# Patient Record
Sex: Female | Born: 1995 | Race: Black or African American | Hispanic: No | Marital: Single
Health system: Southern US, Community
[De-identification: ages and names within clinical notes are randomized; demographics above are authoritative.]

## PROBLEM LIST (undated history)

## (undated) DIAGNOSIS — Z789 Other specified health status: Secondary | ICD-10-CM

## (undated) DIAGNOSIS — N12 Tubulo-interstitial nephritis, not specified as acute or chronic: Secondary | ICD-10-CM

## (undated) HISTORY — PX: WISDOM TOOTH EXTRACTION: SHX21

---
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
## 2014-01-03 ENCOUNTER — Ambulatory Visit (INDEPENDENT_AMBULATORY_CARE_PROVIDER_SITE_OTHER): Payer: Medicaid Other

## 2014-01-03 DIAGNOSIS — Z3042 Encounter for surveillance of injectable contraceptive: Secondary | ICD-10-CM

## 2014-01-03 DIAGNOSIS — Z3049 Encounter for surveillance of other contraceptives: Secondary | ICD-10-CM

## 2014-01-03 DIAGNOSIS — Z3202 Encounter for pregnancy test, result negative: Secondary | ICD-10-CM

## 2014-01-03 LAB — POCT URINE PREGNANCY: Preg Test, Ur: NEGATIVE

## 2014-01-03 MED ORDER — MEDROXYPROGESTERONE ACETATE 150 MG/ML IM SUSP
150.0000 mg | Freq: Once | INTRAMUSCULAR | Status: AC
  Administered 2014-01-03: 150 mg via INTRAMUSCULAR

## 2014-01-03 NOTE — Progress Notes (Signed)
Patient presented to the office late for Depo injection (due prior to 11/30).  Urine pregnancy test was negative.  Injection given without complication.  Patient is attending college 4 hours away.  Rescheduled a follow up for 03/27/14; advised patient if she transfer care to sign a ROI where she will be going and we will send medical records to them.  She verbalized understanding.

## 2014-03-27 ENCOUNTER — Ambulatory Visit: Payer: Medicaid Other | Admitting: Pediatrics

## 2014-03-31 ENCOUNTER — Ambulatory Visit (INDEPENDENT_AMBULATORY_CARE_PROVIDER_SITE_OTHER): Payer: Medicaid Other | Admitting: *Deleted

## 2014-03-31 ENCOUNTER — Encounter (INDEPENDENT_AMBULATORY_CARE_PROVIDER_SITE_OTHER): Payer: Self-pay

## 2014-03-31 DIAGNOSIS — Z309 Encounter for contraceptive management, unspecified: Secondary | ICD-10-CM | POA: Diagnosis not present

## 2014-03-31 DIAGNOSIS — Z113 Encounter for screening for infections with a predominantly sexual mode of transmission: Secondary | ICD-10-CM

## 2014-03-31 DIAGNOSIS — Z3202 Encounter for pregnancy test, result negative: Secondary | ICD-10-CM | POA: Diagnosis not present

## 2014-03-31 DIAGNOSIS — Z3042 Encounter for surveillance of injectable contraceptive: Secondary | ICD-10-CM

## 2014-03-31 LAB — POCT URINE PREGNANCY: PREG TEST UR: NEGATIVE

## 2014-03-31 MED ORDER — MEDROXYPROGESTERONE ACETATE 150 MG/ML IM SUSP
150.0000 mg | Freq: Once | INTRAMUSCULAR | Status: AC
Start: 2014-03-31 — End: 2014-03-31
  Administered 2014-03-31: 150 mg via INTRAMUSCULAR

## 2014-04-01 LAB — GC/CHLAMYDIA PROBE AMP, URINE
Chlamydia, Swab/Urine, PCR: NEGATIVE
GC Probe Amp, Urine: NEGATIVE

## 2014-04-14 NOTE — Progress Notes (Signed)
  Subjective:    Sherryll BurgerShytaysha is a 19 y.o. old female here for depoprovera injection.    HPI Patient her for depoprovera injection.  This is the second time that she has been late to receive her depo injection.  She reports that she is in school several hours away and has been trying to get her Depo-provera injections arranged at her student health.   She denies any menstrual concerns.  She has been sexually active with one female partner and does not always use condoms.  Last STI screening was 9 months ago and patient would like GC/Chlamydia testing today.  She [facility]   Review of Systems  No fevers, no heavy menstrual bleeding, no vaginal discharge.  History and Problem List: Sherryll BurgerShytaysha has Dysmenorrhea in the adolescent; Contraception management; Unspecified asthma(493.90); and Knee pain, bilateral on her problem list.  Sherryll BurgerShytaysha  has a past medical history of No pertinent past medical history; Asthma; and Pyelonephritis.       Objective:    LMP 03/24/2014 (Exact Date) Physical Exam  Constitutional: She is oriented to person, place, and time. She appears well-developed and well-nourished. No distress.  Neurological: She is alert and oriented to person, place, and time.  Psychiatric: She has a normal mood and affect. Her behavior is normal.       Assessment and Plan:   Sherryll BurgerShytaysha is a 19 y.o. old female her for contraceptive management and STI screening.  Reviewed importance of appropriate interval between Depo injections; briefly discussed alternative contraceptive options.  Patient wishes to continue with Depo at this time.    Return in about 3 months (around 07/01/2014) for Depoprovera.  Shamiah Kahler, Betti CruzKATE S, MD

## 2014-06-16 ENCOUNTER — Encounter (INDEPENDENT_AMBULATORY_CARE_PROVIDER_SITE_OTHER): Payer: Self-pay

## 2014-06-16 ENCOUNTER — Ambulatory Visit (INDEPENDENT_AMBULATORY_CARE_PROVIDER_SITE_OTHER): Payer: Medicaid Other | Admitting: *Deleted

## 2014-06-16 ENCOUNTER — Ambulatory Visit: Payer: Self-pay | Admitting: *Deleted

## 2014-06-16 DIAGNOSIS — Z3042 Encounter for surveillance of injectable contraceptive: Secondary | ICD-10-CM

## 2014-06-16 DIAGNOSIS — Z3049 Encounter for surveillance of other contraceptives: Secondary | ICD-10-CM

## 2014-06-16 MED ORDER — MEDROXYPROGESTERONE ACETATE 150 MG/ML IM SUSP
150.0000 mg | Freq: Once | INTRAMUSCULAR | Status: AC
  Administered 2014-06-16: 150 mg via INTRAMUSCULAR

## 2014-06-16 NOTE — Progress Notes (Signed)
Pt presents today for depo. Pt inside window for contraception coverage. No HCG needed. Allergies reviewed, no concerns, injection given and tolerated,  f/u appt scheduled.

## 2014-07-16 ENCOUNTER — Encounter (HOSPITAL_COMMUNITY): Payer: Self-pay | Admitting: *Deleted

## 2014-07-16 ENCOUNTER — Emergency Department (HOSPITAL_COMMUNITY)
Admission: EM | Admit: 2014-07-16 | Discharge: 2014-07-16 | Disposition: A | Discharge status: Home / Self Care | Payer: BLUE CROSS/BLUE SHIELD | Attending: Emergency Medicine | Admitting: Emergency Medicine

## 2014-07-16 DIAGNOSIS — X58XXXA Exposure to other specified factors, initial encounter: Secondary | ICD-10-CM | POA: Diagnosis not present

## 2014-07-16 DIAGNOSIS — Y999 Unspecified external cause status: Secondary | ICD-10-CM | POA: Diagnosis not present

## 2014-07-16 DIAGNOSIS — Z87448 Personal history of other diseases of urinary system: Secondary | ICD-10-CM | POA: Insufficient documentation

## 2014-07-16 DIAGNOSIS — S76311A Strain of muscle, fascia and tendon of the posterior muscle group at thigh level, right thigh, initial encounter: Secondary | ICD-10-CM | POA: Diagnosis not present

## 2014-07-16 DIAGNOSIS — Y929 Unspecified place or not applicable: Secondary | ICD-10-CM | POA: Diagnosis not present

## 2014-07-16 DIAGNOSIS — Y939 Activity, unspecified: Secondary | ICD-10-CM | POA: Diagnosis not present

## 2014-07-16 DIAGNOSIS — J45909 Unspecified asthma, uncomplicated: Secondary | ICD-10-CM | POA: Insufficient documentation

## 2014-07-16 DIAGNOSIS — S79921A Unspecified injury of right thigh, initial encounter: Secondary | ICD-10-CM | POA: Diagnosis present

## 2014-07-16 MED ORDER — IBUPROFEN 400 MG PO TABS
800.0000 mg | ORAL_TABLET | Freq: Once | ORAL | Status: AC
  Administered 2014-07-16: 800 mg via ORAL
  Filled 2014-07-16: qty 2

## 2014-07-16 MED ORDER — IBUPROFEN 600 MG PO TABS: 600.0000 mg | ORAL_TABLET | Freq: Four times a day (QID) | ORAL | Status: DC | PRN

## 2014-07-16 NOTE — ED Notes (Signed)
Applied ace wrap to right leg above the knee

## 2014-07-16 NOTE — ED Provider Notes (Signed)
History  This chart was scribed for non-physician practitioner, Francee Piccolo, PA-C,working with Elwin Mocha, MD, by Karle Plumber, ED Scribe. This patient was seen in room TR08C/TR08C and the patient's care was started at 9:02 PM.  Chief Complaint  Patient presents with  . Leg Pain   The history is provided by the patient and medical records. No language interpreter was used.    HPI Comments:  Crystal Velez is a 19 y.o. female who presents to the Emergency Department complaining of aching pain in the posterior right thigh that started approximately 7 hours ago. She states she was at work and reached to help a pt stating she may have stepped off of it the wrong way and feels as if she pulled a muscle. She finished her shift but has not taken anything for pain. Bearing weight on the leg makes the pain worse. She denies any alleviating factors. She denies SOB, calf pain, calf soreness, calf swelling, numbness, tingling or weakness of the RLE, swelling, bruising, warmth or wounds. She denies any past surgeries. She reports being on Depo Provera injection. Denies h/o PE or DVT.  Past Medical History  Diagnosis Date  . No pertinent past medical history   . Asthma   . Pyelonephritis    Past Surgical History  Procedure Laterality Date  . Wisdom tooth extraction     Family History  Problem Relation Age of Onset  . Asthma Mother   . Hypertension Mother   . Stroke Maternal Grandfather   . Diabetes Maternal Grandmother   . Vision loss Maternal Grandmother    History  Substance Use Topics  . Smoking status: Never Smoker   . Smokeless tobacco: Never Used  . Alcohol Use: No   OB History    Gravida Para Term Preterm AB TAB SAB Ectopic Multiple Living   0              Review of Systems  Cardiovascular: Negative for leg swelling.  Musculoskeletal: Positive for myalgias.  Skin: Negative for color change and wound.  Neurological: Negative for weakness and numbness.   All other systems reviewed and are negative.   Allergies  Review of patient's allergies indicates no known allergies.  Home Medications   Prior to Admission medications   Medication Sig Start Date End Date Taking? Authorizing Provider  albuterol (PROVENTIL HFA;VENTOLIN HFA) 108 (90 BASE) MCG/ACT inhaler Inhale 2 puffs into the lungs every 6 (six) hours as needed for wheezing or shortness of breath. 02/01/13   Owens Shark, MD  beclomethasone (QVAR) 80 MCG/ACT inhaler Inhale 1 puff into the lungs 2 (two) times daily as needed (shortness of breath).    Historical Provider, MD  ibuprofen (ADVIL) 600 MG tablet Take one tablet every 6 hours as needed for pain 07/27/13   Gregor Hams, NP  ibuprofen (ADVIL,MOTRIN) 600 MG tablet Take 1 tablet (600 mg total) by mouth every 6 (six) hours as needed. 07/16/14   Aniayah Alaniz, PA-C  medroxyPROGESTERone (DEPO-PROVERA) 150 MG/ML injection Inject 150 mg into the muscle every 3 (three) months. Last injection Jan 18, 2013    Historical Provider, MD   Triage Vitals: BP 119/78 mmHg  Pulse 76  Temp(Src) 98 F (36.7 C) (Oral)  Resp 18  Ht  (1.6 m)  Wt 134 lb (60.782 kg)  BMI 23.74 kg/m2  SpO2 95%  LMP 07/13/2014 Physical Exam  Constitutional: She is oriented to person, place, and time. She appears well-developed and well-nourished. No distress.  HENT:  Head: Normocephalic and atraumatic.  Right Ear: External ear normal.  Left Ear: External ear normal.  Nose: Nose normal.  Mouth/Throat: Oropharynx is clear and moist.  Eyes: Conjunctivae are normal.  Neck: Normal range of motion. Neck supple.  No nuchal rigidity.   Cardiovascular: Normal rate, regular rhythm, normal heart sounds and intact distal pulses.   Pulmonary/Chest: Effort normal and breath sounds normal.  Abdominal: Soft.  Musculoskeletal: Normal range of motion. She exhibits no edema.       Left knee: Normal.       Left upper leg: She exhibits tenderness. She exhibits no  bony tenderness, no swelling, no deformity and no laceration.       Left lower leg: Normal.  No lower extremity swelling.  Neurological: She is alert and oriented to person, place, and time.  Skin: Skin is warm and dry. She is not diaphoretic.  Psychiatric: She has a normal mood and affect.  Nursing note and vitals reviewed.   ED Course  Procedures (including critical care time) Medications  ibuprofen (ADVIL,MOTRIN) tablet 800 mg (800 mg Oral Given 07/16/14 2113)    DIAGNOSTIC STUDIES: Oxygen Saturation is 95% on RA, adequate by my interpretation.   COORDINATION OF CARE: 9:07 PM- Will order ace wrap and Ibuprofen. Pt verbalizes understanding and agrees to plan.  Medications  ibuprofen (ADVIL,MOTRIN) tablet 800 mg (800 mg Oral Given 07/16/14 2113)    Labs Review Labs Reviewed - No data to display  Imaging Review No results found.   EKG Interpretation None      MDM   Final diagnoses:  Right hamstring muscle strain, initial encounter    Filed Vitals:   07/16/14 2025  BP: 119/78  Pulse: 76  Temp: 98 F (36.7 C)  Resp: 18   Afebrile, NAD, non-toxic appearing, AAOx4. Patient with tender right hamstring tenderness. No swelling. Neurovascularly intact. Normal sensation. No evidence of compartment syndrome. Pain managed in ED. Pt advised to follow up with PCP if symptoms persist for possibility of missed fracture diagnosis. Patient given motrin and ace wrap while in ED, conservative therapy recommended and discussed. Patient will be dc home & is agreeable with above plan.     I personally performed the services described in this documentation, which was scribed in my presence. The recorded information has been reviewed and is accurate.    Francee Piccolo, PA-C 07/16/14 2205  Elwin Mocha, MD 07/16/14 [phone]

## 2014-07-16 NOTE — ED Notes (Signed)
Pt states that she pulled a muscle in her rt leg today and work.

## 2014-07-16 NOTE — Discharge Instructions (Signed)
Please follow up with your primary care physician in 1-2 days. If you do not have one please call the [facility] and wellness Center number listed above. Please read all discharge instructions and return precautions.   Hamstring Strain  Hamstrings are the large muscles in the back of the thighs. A strain or tear injury happens when there is a sudden stretch or pull on these muscles and tendons. Tendons are cord like structures that attach muscle to bone. These injuries are commonly seen in activities such as sprinting due to sudden acceleration.  DIAGNOSIS  Often the diagnosis can be made by examination. HOME CARE INSTRUCTIONS   Apply ice to the sore area for 15-69minutes, 03-04 times per day. Do this while awake for the first 2 days. Put the ice in a plastic bag, and place a towel between the bag of ice and your skin.  Keep your knee flexed when possible. This means your foot is held off the ground slightly if you are on crutches. When lying down, a pillow under the knee will take strain off the muscles and provide some relief.  If a compression bandage such as an ace wrap was applied, use it until you are seen again. You may remove it for sleeping, showers and baths. If the wrap seems to be too tight and is uncomfortable, wrap it more loosely. If your toes or foot are getting cold or blue, it is too tight.  Walk or move around as the pain allows, or as instructed. Resume full activities as suggested by your caregiver. This is often safest when the strength of the injured leg has nearly returned to normal.  Only take over-the-counter or prescription medicines for pain, discomfort, or fever as directed by your caregiver. SEEK MEDICAL CARE IF:   You have an increase in bruising, swelling or pain.  You notice coldness or blueness of your toes or foot.  Pain relief is not obtained with medications.  You have increasing pain in the area and seem to be getting worse rather than better.  You  notice your thigh getting larger in size (this could indicate bleeding into the muscle). Document Released: 10/01/2000 Document Revised: 03/31/2011 Document Reviewed: 01/09/2008 [facility] Patient Information 2015 [place], [place]. This information is not intended to replace advice given to you by your health care provider. Make sure you discuss any questions you have with your health care provider.

## 2014-08-21 ENCOUNTER — Inpatient Hospital Stay (HOSPITAL_COMMUNITY)
Admission: AD | Admit: 2014-08-21 | Discharge: 2014-08-21 | Disposition: A | Discharge status: Home / Self Care | Payer: Self-pay | Source: Ambulatory Visit | Attending: Family Medicine | Admitting: Family Medicine

## 2014-08-21 DIAGNOSIS — N939 Abnormal uterine and vaginal bleeding, unspecified: Secondary | ICD-10-CM

## 2014-08-21 LAB — URINALYSIS, ROUTINE W REFLEX MICROSCOPIC
Bilirubin Urine: NEGATIVE
Glucose, UA: NEGATIVE mg/dL
Ketones, ur: NEGATIVE mg/dL
Nitrite: NEGATIVE
Protein, ur: NEGATIVE mg/dL
Specific Gravity, Urine: >1.03 — ABNORMAL HIGH (ref 1.005–1.030)
Urobilinogen, UA: 0.2 mg/dL (ref 0.0–1.0)
pH: 5.5 (ref 5.0–8.0)

## 2014-08-21 LAB — WET PREP, GENITAL
Clue Cells Wet Prep HPF POC: NONE SEEN
Trich, Wet Prep: NONE SEEN
Yeast Wet Prep HPF POC: NONE SEEN

## 2014-08-21 LAB — URINE MICROSCOPIC-ADD ON

## 2014-08-21 LAB — POCT PREGNANCY, URINE: PREG TEST UR: NEGATIVE

## 2014-08-21 MED ORDER — ESTRADIOL 0.5 MG PO TABS: 0.5000 mg | ORAL_TABLET | Freq: Every day | ORAL | Status: DC

## 2014-08-21 NOTE — MAU Provider Note (Signed)
History     CSN: [id number]  Arrival date and time: 08/21/14 1555   None     Chief Complaint  Patient presents with  . Vaginal Bleeding   HPI Pt is not pregnant and c/o of bleeding after IC since February; pt has been on Depo for 3 years([facility])- last injection in May.- denies cramping- 1st day is like a regular menstrual ; pt has partners who are "friends with benefits" And changes 3 times a day and then goes to [place].  And lasts for 1 week. Denies pain with urination, constipation, diarrhea, Denies fever, chills, nausea.   RN note: ] Has been on depo for the last 3 years, last dose was in May. Has been bleeding after sex for the last 4 months. Denies pain.        Past Medical History  Diagnosis Date  . No pertinent past medical history   . Asthma   . Pyelonephritis     Past Surgical History  Procedure Laterality Date  . Wisdom tooth extraction      Family History  Problem Relation Age of Onset  . Asthma Mother   . Hypertension Mother   . Stroke Maternal Grandfather   . Diabetes Maternal Grandmother   . Vision loss Maternal Grandmother     History  Substance Use Topics  . Smoking status: Never Smoker   . Smokeless tobacco: Never Used  . Alcohol Use: No    Allergies: No Known Allergies  Prescriptions prior to admission  Medication Sig Dispense Refill Last Dose  . albuterol (PROVENTIL HFA;VENTOLIN HFA) 108 (90 BASE) MCG/ACT inhaler Inhale 2 puffs into the lungs every 6 (six) hours as needed for wheezing or shortness of breath. 1 Inhaler 2 Taking  . beclomethasone (QVAR) 80 MCG/ACT inhaler Inhale 1 puff into the lungs 2 (two) times daily as needed (shortness of breath).   Taking  . ibuprofen (ADVIL) 600 MG tablet Take one tablet every 6 hours as needed for pain 60 tablet 1   . ibuprofen (ADVIL,MOTRIN) 600 MG tablet Take 1 tablet (600 mg total) by mouth every 6 (six) hours as needed. 30 tablet 0   . medroxyPROGESTERone (DEPO-PROVERA) 150 MG/ML  injection Inject 150 mg into the muscle every 3 (three) months. Last injection Jan 18, 2013   Taking    Review of Systems  Constitutional: Negative for fever and chills.  Gastrointestinal: Negative for nausea, vomiting, abdominal pain, diarrhea and constipation.  Genitourinary: Negative for dysuria.   Physical Exam   Blood pressure 141/80, pulse 80, temperature 99.5 F (37.5 C), temperature source Oral, resp. rate 18, height  (1.6 m), weight 129 lb 3.2 oz (58.605 kg).  Physical Exam  Nursing note and vitals reviewed. Constitutional: She is oriented to person, place, and time. She appears well-developed and well-nourished. No distress.  HENT:  Head: Normocephalic.  Eyes: Pupils are equal, round, and reactive to light.  Neck: Normal range of motion. Neck supple.  Cardiovascular: Normal rate.   Respiratory: Effort normal.  GI: Soft.  Genitourinary:  Vaginal mucosa mildly reddened; small amount of cream colored discharge in vault; cervix clean, NT; uterus NSSC NT; adnexae without palpable enlargement or tenderness  Musculoskeletal: Normal range of motion.  Neurological: She is alert and oriented to person, place, and time.  Skin: Skin is warm and dry.  Psychiatric: She has a normal mood and affect.    MAU Course  Procedures Results for orders placed or performed during the hospital encounter of 08/21/14 (from  the past 24 hour(s))  Urinalysis, Routine w reflex microscopic (not at [facility])     Status: Abnormal   Collection Time: 08/21/14  4:10 PM  Result Value Ref Range   Color, Urine YELLOW YELLOW   APPearance CLEAR CLEAR   Specific Gravity, Urine >1.030 (H) 1.005 - 1.030   pH 5.5 5.0 - 8.0   Glucose, UA NEGATIVE NEGATIVE mg/dL   Hgb urine dipstick MODERATE (A) NEGATIVE   Bilirubin Urine NEGATIVE NEGATIVE   Ketones, ur NEGATIVE NEGATIVE mg/dL   Protein, ur NEGATIVE NEGATIVE mg/dL   Urobilinogen, UA 0.2 0.0 - 1.0 mg/dL   Nitrite NEGATIVE NEGATIVE   Leukocytes, UA TRACE (A)  NEGATIVE  Urine microscopic-add on     Status: Abnormal   Collection Time: 08/21/14  4:10 PM  Result Value Ref Range   Squamous Epithelial / LPF MANY (A) RARE   WBC, UA 3-6 <3 WBC/hpf   RBC / HPF 3-6 <3 RBC/hpf   Bacteria, UA MANY (A) RARE   Urine-Other MUCOUS PRESENT   Wet prep, genital     Status: Abnormal   Collection Time: 08/21/14  5:16 PM  Result Value Ref Range   Yeast Wet Prep HPF POC NONE SEEN NONE SEEN   Trich, Wet Prep NONE SEEN NONE SEEN   Clue Cells Wet Prep HPF POC NONE SEEN NONE SEEN   WBC, Wet Prep HPF POC FEW (A) NONE SEEN     Assessment and Plan  Bleeding on Depo- AUB Estradiol 0.05mg  tab- one daily for 30 days F/u with [facility] for Depo Provera injection Safe sex  Tanazia Achee 08/21/2014, 5:09 PM

## 2014-08-21 NOTE — MAU Note (Signed)
Has been on depo for the last 3 years, last dose was in May.  Has been bleeding after sex for the last 4 months.  Denies pain.

## 2014-08-22 LAB — GC/CHLAMYDIA PROBE AMP (~~LOC~~) NOT AT ARMC
CHLAMYDIA, DNA PROBE: POSITIVE — AB
Neisseria Gonorrhea: NEGATIVE

## 2014-08-23 ENCOUNTER — Telehealth (HOSPITAL_COMMUNITY): Payer: Self-pay | Admitting: Obstetrics & Gynecology

## 2014-08-23 DIAGNOSIS — A749 Chlamydial infection, unspecified: Secondary | ICD-10-CM

## 2014-08-23 MED ORDER — AZITHROMYCIN 500 MG PO TABS: ORAL_TABLET | ORAL | Status: DC

## 2014-08-23 NOTE — Telephone Encounter (Signed)
Patient returned call, notified her of positive chlamydia culture and need for treatment.   Rx routed to patients pharmacy.  Instructed patient to notify her partner for treatment and to abstain from sex for seven days post treatment.

## 2014-09-01 ENCOUNTER — Ambulatory Visit (INDEPENDENT_AMBULATORY_CARE_PROVIDER_SITE_OTHER): Payer: Self-pay | Admitting: *Deleted

## 2014-09-01 ENCOUNTER — Encounter: Payer: Self-pay | Admitting: *Deleted

## 2014-09-01 DIAGNOSIS — Z3042 Encounter for surveillance of injectable contraceptive: Secondary | ICD-10-CM

## 2014-09-01 DIAGNOSIS — Z3049 Encounter for surveillance of other contraceptives: Secondary | ICD-10-CM

## 2014-09-01 MED ORDER — MEDROXYPROGESTERONE ACETATE 150 MG/ML IM SUSP
150.0000 mg | Freq: Once | INTRAMUSCULAR | Status: AC
  Administered 2014-09-01: 150 mg via INTRAMUSCULAR

## 2014-09-01 NOTE — Progress Notes (Signed)
Pt presents today for depo shot. Last depo 06/16/14. HCG not necessary. Depo administered. Tolerated well. Next appt scheduled w/in depo window.

## 2014-09-04 ENCOUNTER — Ambulatory Visit: Payer: Self-pay | Admitting: *Deleted

## 2014-11-17 ENCOUNTER — Ambulatory Visit: Payer: Self-pay | Admitting: *Deleted
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
## 2015-12-10 IMAGING — CT CT ABD-PELV W/ CM
2 of 4 series · 16 of 46 positions shown, 18 images · IV contrast (omnipaque)
Comparison: None.

CLINICAL DATA: Abdominal pain and tenderness.  Hematuria.

EXAM:
CT ABDOMEN AND PELVIS WITH CONTRAST
TECHNIQUE: Multidetector CT imaging of the abdomen and pelvis was performed
using the standard protocol following bolus administration of
intravenous contrast.
CONTRAST:  100mL OMNIPAQUE IOHEXOL 300 MG/ML  SOLN

[Series 2: abd/ pelvis 5.0 i30f 1 · axial · 0.52mm/px · z∈[-451,-71]mm · 13 of 84 slices shown, 15 images]
[im 4/84  soft-tissue]
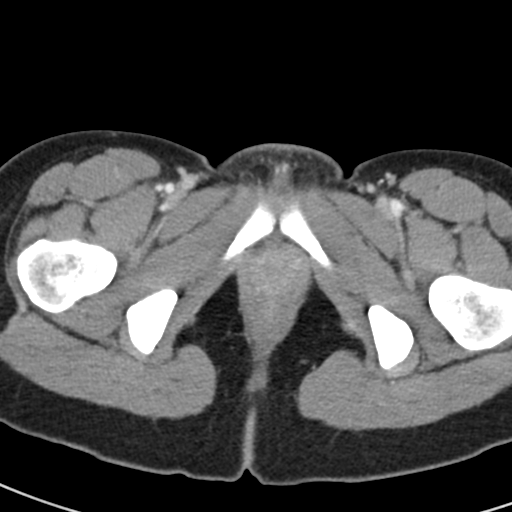
[im 4/84  bone]
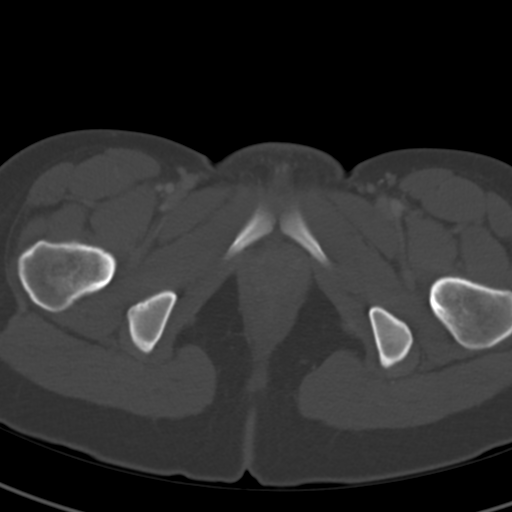
[im 10/84  soft-tissue]
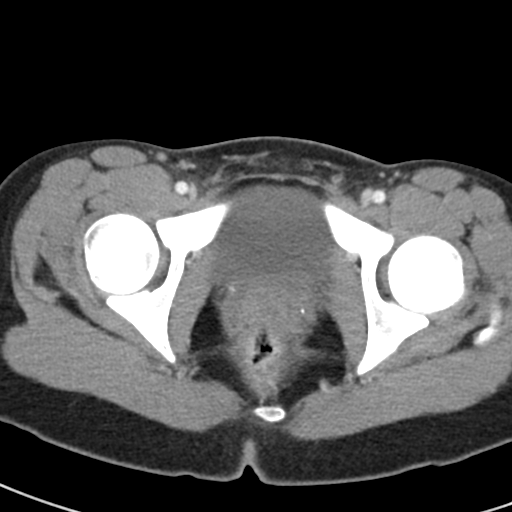
[im 16/84  soft-tissue]
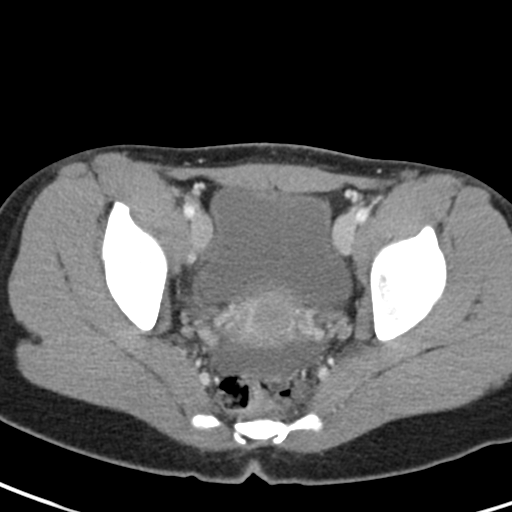
[im 23/84  soft-tissue]
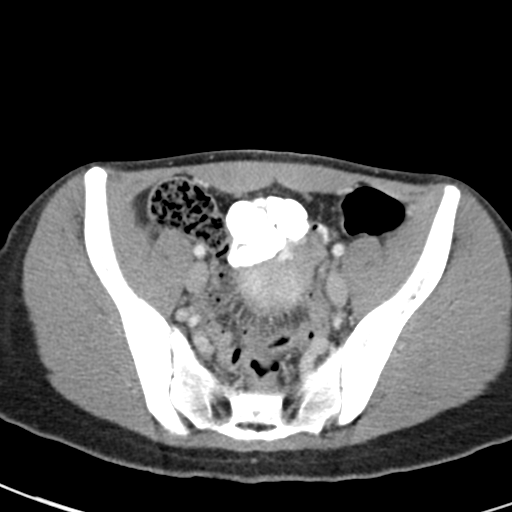
[im 29/84  soft-tissue]
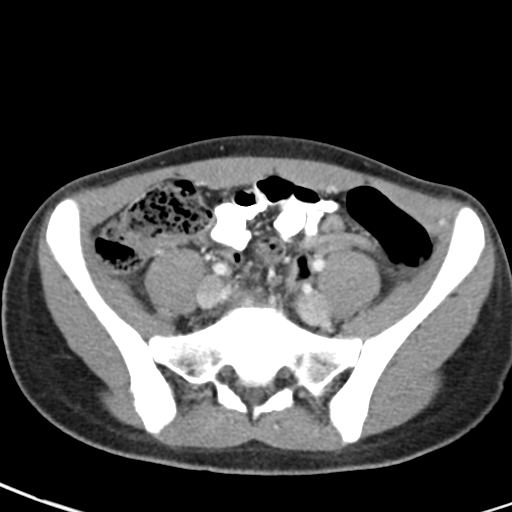
[im 36/84  soft-tissue]
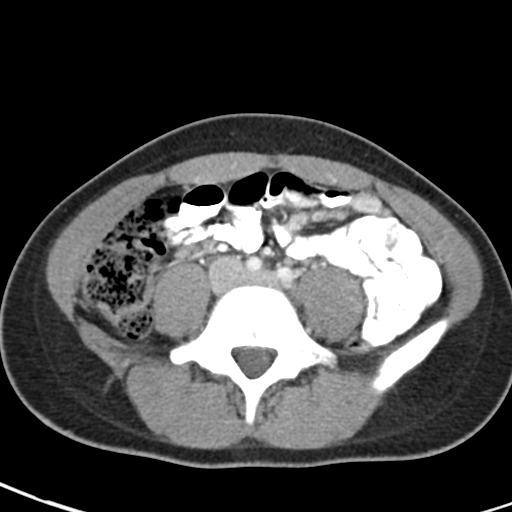
[im 42/84  soft-tissue]
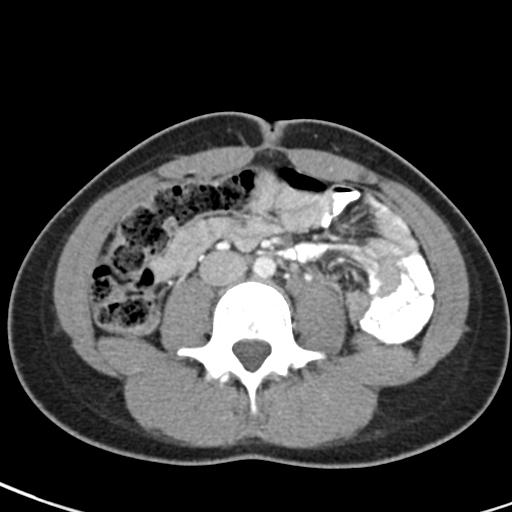
[im 48/84  soft-tissue]
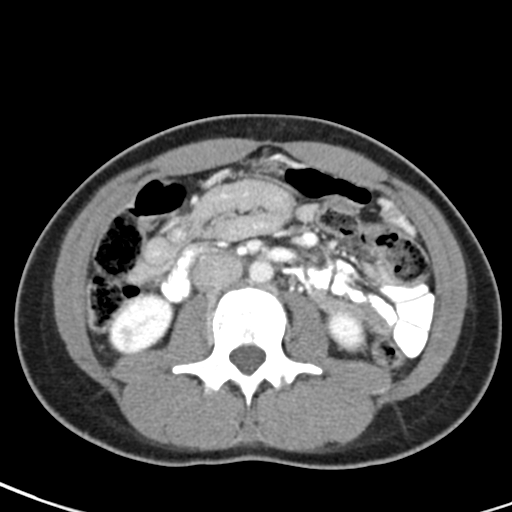
[im 55/84  soft-tissue]
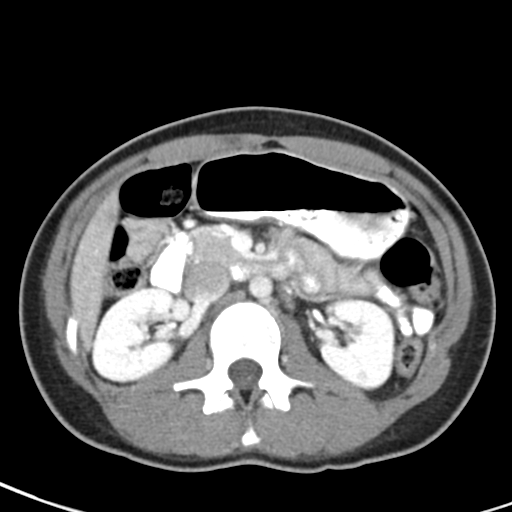
[im 55/84  bone]
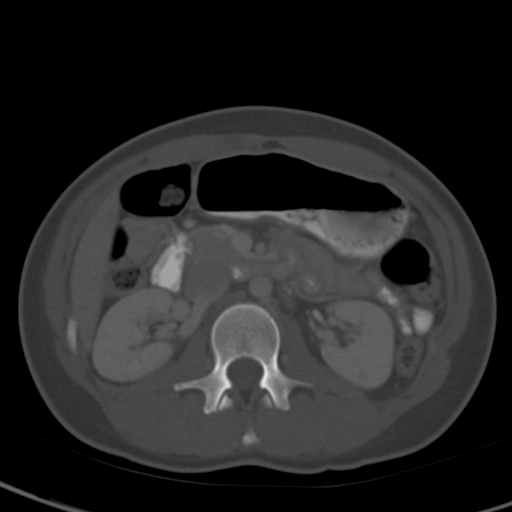
[im 61/84  soft-tissue]
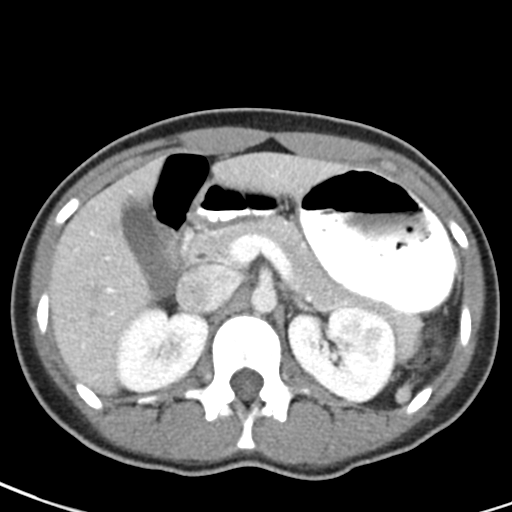
[im 68/84  soft-tissue]
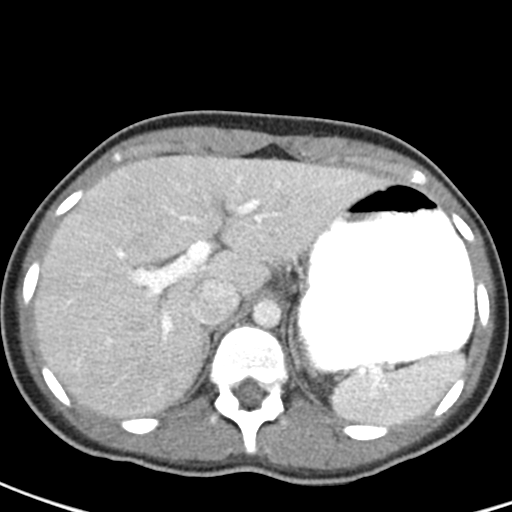
[im 74/84  soft-tissue]
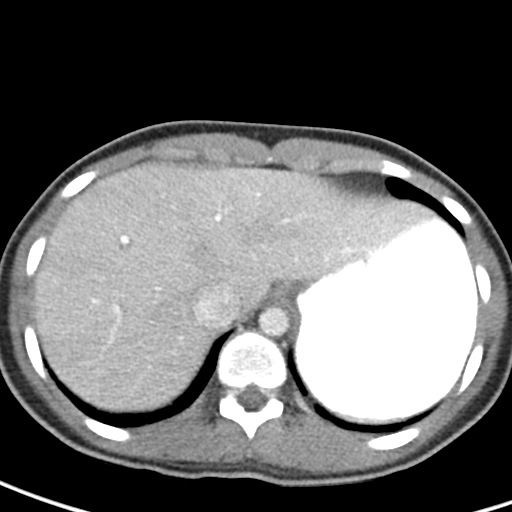
[im 80/84  soft-tissue]
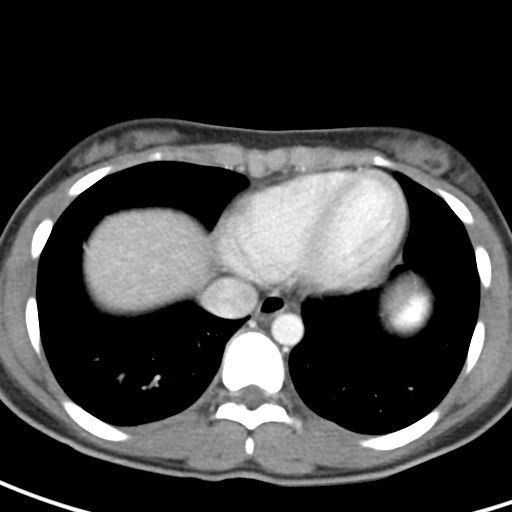

[Series 5: cor · coronal · 0.56mm/px · 3 of 92 slices shown]
[im 31/92  soft-tissue]
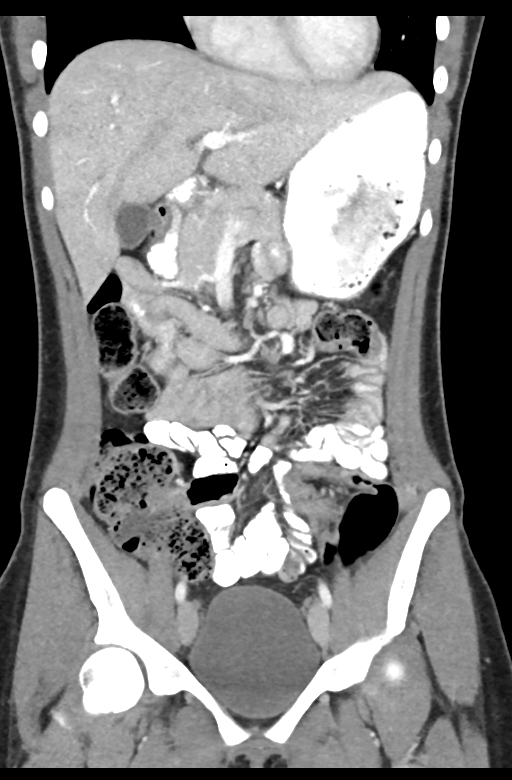
[im 41/92  soft-tissue]
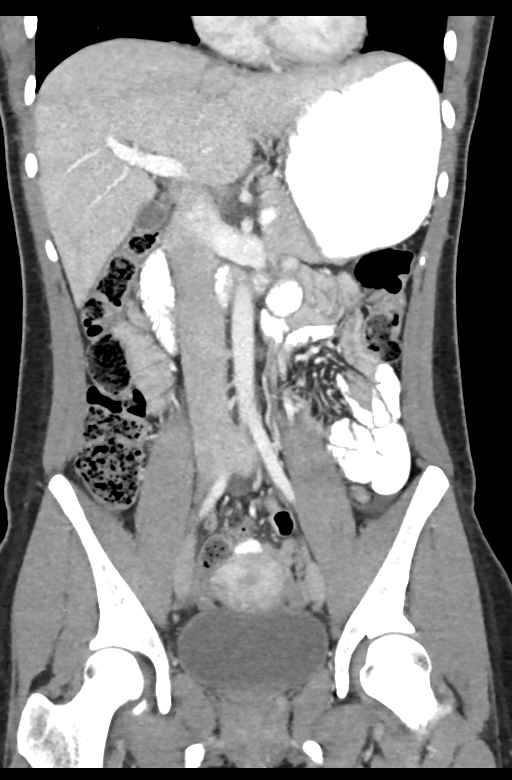
[im 51/92  soft-tissue]
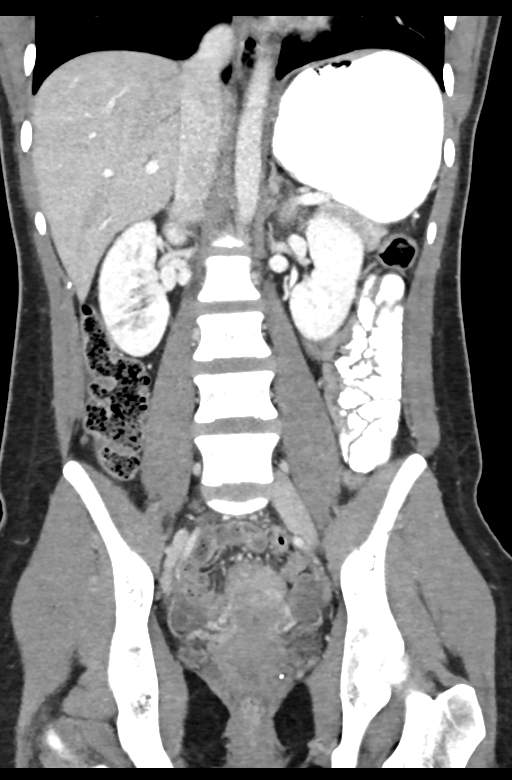

[16 of 46 positions shown; findings below may reference images not displayed]

FINDINGS: Lung bases are within normal.

Abdominal images demonstrate a normal liver, spleen, pancreas,
gallbladder and adrenal glands. Appendix is within normal.

Kidneys are normal in size without hydronephrosis or
nephrolithiasis. There are patchy areas of bilateral cortical
low-attenuation most prominent over the mid to upper pole of the
anterior left renal cortex which do not appear represent typical
cystic change and more likely reflect pyelonephritis. There are no
significant associated perinephric inflammatory changes and no
evidence of perinephric fluid. Ureters are within normal.

Pelvic images demonstrate mild amount of free fluid in the
cul-de-sac. Remaining pelvic structures are unremarkable. Remaining
bones and soft tissues are within normal.
IMPRESSION: Normal size kidneys with patchy areas of bilateral cortical
low-attenuation suggesting pyelonephritis. No significant
perinephric fluid/inflammation. Recommend clinical correlation.

Mild free pelvic fluid.

## 2016-12-13 ENCOUNTER — Inpatient Hospital Stay (HOSPITAL_COMMUNITY)
Admission: AD | Admit: 2016-12-13 | Discharge: 2016-12-13 | Disposition: A | Discharge status: Home / Self Care | Payer: BLUE CROSS/BLUE SHIELD | Source: Ambulatory Visit | Attending: Obstetrics and Gynecology | Admitting: Obstetrics and Gynecology

## 2016-12-13 DIAGNOSIS — N760 Acute vaginitis: Secondary | ICD-10-CM | POA: Insufficient documentation

## 2016-12-13 DIAGNOSIS — N898 Other specified noninflammatory disorders of vagina: Secondary | ICD-10-CM

## 2016-12-13 DIAGNOSIS — B9689 Other specified bacterial agents as the cause of diseases classified elsewhere: Secondary | ICD-10-CM

## 2016-12-13 DIAGNOSIS — J45909 Unspecified asthma, uncomplicated: Secondary | ICD-10-CM | POA: Diagnosis not present

## 2016-12-13 LAB — URINALYSIS, ROUTINE W REFLEX MICROSCOPIC
Bilirubin Urine: NEGATIVE
Glucose, UA: NEGATIVE mg/dL
HGB URINE DIPSTICK: NEGATIVE
Ketones, ur: NEGATIVE mg/dL
Nitrite: NEGATIVE
PROTEIN: NEGATIVE mg/dL
Specific Gravity, Urine: 1.021 (ref 1.005–1.030)
pH: 6 (ref 5.0–8.0)

## 2016-12-13 LAB — WET PREP, GENITAL
Sperm: NONE SEEN
Trich, Wet Prep: NONE SEEN
YEAST WET PREP: NONE SEEN

## 2016-12-13 LAB — POCT PREGNANCY, URINE: PREG TEST UR: NEGATIVE

## 2016-12-13 MED ORDER — METRONIDAZOLE 500 MG PO TABS: 500.0000 mg | ORAL_TABLET | Freq: Two times a day (BID) | ORAL | 0 refills | Status: AC

## 2016-12-13 NOTE — Discharge Instructions (Signed)

## 2016-12-13 NOTE — MAU Provider Note (Signed)
History   21 yo female in with c/o vag odor for several weeks. Light discharge noted over past several.  CSN: [id number]  Arrival date & time 12/13/16  1621   None     Chief Complaint  Patient presents with  . Vaginal Discharge    HPI  Past Medical History:  Diagnosis Date  . Asthma   . No pertinent past medical history   . Pyelonephritis     Past Surgical History:  Procedure Laterality Date  . WISDOM TOOTH EXTRACTION      Family History  Problem Relation Age of Onset  . Asthma Mother   . Hypertension Mother   . Stroke Maternal Grandfather   . Diabetes Maternal Grandmother   . Vision loss Maternal Grandmother     Social History   Tobacco Use  . Smoking status: Never Smoker  . Smokeless tobacco: Never Used  Substance Use Topics  . Alcohol use: No  . Drug use: No    OB History    Gravida Para Term Preterm AB Living   0             SAB TAB Ectopic Multiple Live Births                  Review of Systems  Constitutional: Negative.   HENT: Negative.   Eyes: Negative.   Respiratory: Negative.   Cardiovascular: Negative.   Gastrointestinal: Negative.   Endocrine: Negative.   Genitourinary:       Vaginal odor  Musculoskeletal: Negative.   Skin: Negative.   Allergic/Immunologic: Negative.   Neurological: Negative.   Hematological: Negative.   Psychiatric/Behavioral: Negative.     Allergies  Patient has no known allergies.  Home Medications   Current Outpatient Rx  . Order #: [id number] Class: Print    BP 132/75   Pulse 90   Temp 98.1 F (36.7 C) (Oral)   Resp 16   Ht 5\' 3"  (1.6 m)   Wt 130 lb (59 kg)   BMI 23.03 kg/m   Physical Exam  Constitutional: She is oriented to person, place, and time. She appears well-developed and well-nourished.  HENT:  Head: Normocephalic.  Eyes: Pupils are equal, round, and reactive to light.  Neck: Normal range of motion.  Cardiovascular: Normal rate, regular rhythm, normal heart sounds and intact  distal pulses.  Pulmonary/Chest: Effort normal and breath sounds normal.  Abdominal: Soft. Bowel sounds are normal.  Genitourinary: Vaginal discharge found.  Neurological: She is alert and oriented to person, place, and time. She has normal reflexes.  Skin: Skin is warm and dry.  Psychiatric: She has a normal mood and affect. Her behavior is normal. Judgment and thought content normal.    MAU Course  Procedures (including critical care time)  Labs Reviewed  WET PREP, GENITAL - Abnormal; Notable for the following components:      Result Value   Clue Cells Wet Prep HPF POC PRESENT (*)    WBC, Wet Prep HPF POC MANY (*)    All other components within normal limits  URINALYSIS, ROUTINE W REFLEX MICROSCOPIC - Abnormal; Notable for the following components:   APPearance HAZY (*)    Leukocytes, UA LARGE (*)    Bacteria, UA RARE (*)    Squamous Epithelial / LPF 6-30 (*)    All other components within normal limits  POCT PREGNANCY, URINE  GC/CHLAMYDIA PROBE AMP [facility]   No results found.   1. Vaginal odor   2.  Bacterial vaginitis       MDM  VSS, Wet prep TNTC clue. Cultures done.  Will treat for BV and d/c home

## 2016-12-13 NOTE — MAU Note (Signed)
Pt states she has been having increased vaginal discharge an odor. States she is gay but is sexual active and doesn't use protection. She [facility] ago [facility] and everything was negative but they gave her a shot "just in case". They told her they would call if anything came back positive and she has not heard back.

## 2016-12-15 LAB — GC/CHLAMYDIA PROBE AMP (~~LOC~~) NOT AT ARMC
CHLAMYDIA, DNA PROBE: NEGATIVE
NEISSERIA GONORRHEA: NEGATIVE

## 2017-06-04 ENCOUNTER — Encounter (HOSPITAL_COMMUNITY): Payer: Self-pay

## 2017-06-04 ENCOUNTER — Other Ambulatory Visit: Payer: Self-pay

## 2017-06-04 DIAGNOSIS — Y939 Activity, unspecified: Secondary | ICD-10-CM | POA: Diagnosis not present

## 2017-06-04 DIAGNOSIS — Y929 Unspecified place or not applicable: Secondary | ICD-10-CM | POA: Diagnosis not present

## 2017-06-04 DIAGNOSIS — Y999 Unspecified external cause status: Secondary | ICD-10-CM | POA: Diagnosis not present

## 2017-06-04 DIAGNOSIS — R51 Headache: Secondary | ICD-10-CM | POA: Diagnosis not present

## 2017-06-04 DIAGNOSIS — S0181XA Laceration without foreign body of other part of head, initial encounter: Secondary | ICD-10-CM | POA: Diagnosis not present

## 2017-06-04 DIAGNOSIS — M542 Cervicalgia: Secondary | ICD-10-CM | POA: Diagnosis not present

## 2017-06-04 DIAGNOSIS — Z23 Encounter for immunization: Secondary | ICD-10-CM | POA: Insufficient documentation

## 2017-06-04 DIAGNOSIS — S1093XA Contusion of unspecified part of neck, initial encounter: Secondary | ICD-10-CM | POA: Diagnosis not present

## 2017-06-04 DIAGNOSIS — S0083XA Contusion of other part of head, initial encounter: Secondary | ICD-10-CM | POA: Diagnosis not present

## 2017-06-04 DIAGNOSIS — S1091XA Abrasion of unspecified part of neck, initial encounter: Secondary | ICD-10-CM | POA: Diagnosis not present

## 2017-06-04 DIAGNOSIS — H1132 Conjunctival hemorrhage, left eye: Secondary | ICD-10-CM | POA: Diagnosis not present

## 2017-06-04 DIAGNOSIS — T07XXXA Unspecified multiple injuries, initial encounter: Secondary | ICD-10-CM | POA: Diagnosis present
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
## 2017-06-05 ENCOUNTER — Emergency Department (HOSPITAL_COMMUNITY): Payer: BLUE CROSS/BLUE SHIELD

## 2017-06-05 ENCOUNTER — Emergency Department (HOSPITAL_COMMUNITY)
Admission: EM | Admit: 2017-06-05 | Discharge: 2017-06-05 | Disposition: A | Discharge status: Home / Self Care | Payer: BLUE CROSS/BLUE SHIELD | Attending: Emergency Medicine | Admitting: Emergency Medicine

## 2017-06-05 ENCOUNTER — Encounter (HOSPITAL_COMMUNITY): Payer: Self-pay

## 2017-06-05 DIAGNOSIS — S0181XA Laceration without foreign body of other part of head, initial encounter: Secondary | ICD-10-CM

## 2017-06-05 DIAGNOSIS — S0083XA Contusion of other part of head, initial encounter: Secondary | ICD-10-CM

## 2017-06-05 LAB — I-STAT CHEM 8, ED
BUN: 9 mg/dL (ref 6–20)
Calcium, Ion: 1.17 mmol/L (ref 1.15–1.40)
Chloride: 106 mmol/L (ref 101–111)
Creatinine, Ser: 0.7 mg/dL (ref 0.44–1.00)
GLUCOSE: 94 mg/dL (ref 65–99)
HCT: 38 % (ref 36.0–46.0)
HEMOGLOBIN: 12.9 g/dL (ref 12.0–15.0)
Potassium: 3.4 mmol/L — ABNORMAL LOW (ref 3.5–5.1)
SODIUM: 139 mmol/L (ref 135–145)
TCO2: 23 mmol/L (ref 22–32)

## 2017-06-05 LAB — URINALYSIS, ROUTINE W REFLEX MICROSCOPIC
Bilirubin Urine: NEGATIVE
GLUCOSE, UA: NEGATIVE mg/dL
HGB URINE DIPSTICK: NEGATIVE
KETONES UR: 20 mg/dL — AB
NITRITE: NEGATIVE
PROTEIN: 100 mg/dL — AB
Specific Gravity, Urine: 1.02 (ref 1.005–1.030)
pH: 5 (ref 5.0–8.0)

## 2017-06-05 LAB — I-STAT BETA HCG BLOOD, ED (MC, WL, AP ONLY)

## 2017-06-05 MED ORDER — TETANUS-DIPHTH-ACELL PERTUSSIS 5-2.5-18.5 LF-MCG/0.5 IM SUSP
0.5000 mL | Freq: Once | INTRAMUSCULAR | Status: AC
  Administered 2017-06-05: 0.5 mL via INTRAMUSCULAR
  Filled 2017-06-05: qty 0.5

## 2017-06-05 MED ORDER — IOPAMIDOL (ISOVUE-370) INJECTION 76%
100.0000 mL | Freq: Once | INTRAVENOUS | Status: AC | PRN
  Administered 2017-06-05: 100 mL via INTRAVENOUS

## 2017-06-05 MED ORDER — IOPAMIDOL (ISOVUE-370) INJECTION 76%
INTRAVENOUS | Status: AC
  Filled 2017-06-05: qty 100

## 2017-06-05 MED ORDER — HYDROCODONE-ACETAMINOPHEN 5-325 MG PO TABS
2.0000 | ORAL_TABLET | Freq: Once | ORAL | Status: AC
  Administered 2017-06-05: 2 via ORAL
  Filled 2017-06-05: qty 2

## 2017-06-05 MED ORDER — LIDOCAINE-EPINEPHRINE (PF) 2 %-1:200000 IJ SOLN
10.0000 mL | Freq: Once | INTRAMUSCULAR | Status: AC
  Administered 2017-06-05: 10 mL
  Filled 2017-06-05: qty 20

## 2017-06-05 NOTE — Discharge Instructions (Addendum)
1. Medications: Ibuprofen or Tylenol for headache 2. Treatment: Rest, ice on head.  Concussion precautions - keep patient in a quiet, not simulating, dark environment. No TV, computer use, video games until headache is resolved completely. No contact sports until cleared by the pediatrician.  Keep laceration clean with warm soap and water.  Do not apply ointments until your stitches have come out.  Stitches should begin to fall out in approximately 7-10 days. 3. Follow Up: With primary care physician on Monday if headache persists.  Return to the emergency department if patient becomes lethargic, begins vomiting, develops double vision, speech difficulty, problems walking or other change in mental status.  Please return to the emergency department for signs or symptoms of infection at your laceration site.
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
## 2017-06-05 NOTE — ED Notes (Signed)
Bed: [facility] Expected date:  Expected time:  Means of arrival:  Comments: 

## 2017-06-06 LAB — URINE CULTURE

## 2017-06-23 ENCOUNTER — Inpatient Hospital Stay
Admit: 2017-06-23 | Discharge: 2017-06-23 | Disposition: A | Discharge status: Home / Self Care | Payer: BLUE CROSS/BLUE SHIELD | Attending: Emergency Medicine

## 2017-06-23 DIAGNOSIS — G44329 Chronic post-traumatic headache, not intractable: Secondary | ICD-10-CM

## 2017-06-23 MED ORDER — KETOROLAC TROMETHAMINE 15 MG/ML INJECTION
15 mg/mL | INTRAMUSCULAR | Status: AC
Start: 2017-06-23 — End: 2017-06-23
  Administered 2017-06-23: 23:00:00 via INTRAMUSCULAR

## 2017-06-23 MED ORDER — BUTALBITAL-ACETAMINOPHEN-CAFFEINE 50 MG-300 MG-40 MG CAPSULE
50-300-40 mg | ORAL_CAPSULE | ORAL | 0 refills | Status: AC | PRN
Start: 2017-06-23 — End: ?

## 2017-06-23 MED FILL — KETOROLAC TROMETHAMINE 15 MG/ML INJECTION: 15 mg/mL | INTRAMUSCULAR | Qty: 1
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
## 2017-06-23 NOTE — ED Notes (Signed)
Pt medicated as ordered; see MAR.
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
## 2017-06-23 NOTE — ED Notes (Signed)
6:46 PM  06/23/17     Discharge instructions given to Gina Malone (name) with verbalization of understanding. Patient accompanied by self.  Patient discharged with the following prescriptions Fioricet. Patient discharged to home (destination).      Kalisha E Cox, RN

## 2017-06-23 NOTE — ED Notes (Signed)
Pt medicated as ordered see MAR
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
## 2017-06-23 NOTE — ED Notes (Signed)
6:46 PM  06/23/17     Discharge instructions given to [facility] (name) with verbalization of understanding. Patient accompanied by self.  Patient discharged with the following prescriptions Fioricet. Patient discharged to home (destination).      Marisue Brooklyn, RN

## 2020-09-30 ENCOUNTER — Emergency Department (HOSPITAL_COMMUNITY): Payer: Self-pay

## 2020-09-30 ENCOUNTER — Other Ambulatory Visit: Payer: Self-pay

## 2020-09-30 ENCOUNTER — Emergency Department (HOSPITAL_COMMUNITY)
Admission: EM | Admit: 2020-09-30 | Discharge: 2020-09-30 | Disposition: A | Discharge status: Home / Self Care | Payer: Self-pay | Attending: Emergency Medicine | Admitting: Emergency Medicine

## 2020-09-30 DIAGNOSIS — M25551 Pain in right hip: Secondary | ICD-10-CM

## 2020-09-30 DIAGNOSIS — R52 Pain, unspecified: Secondary | ICD-10-CM

## 2020-09-30 DIAGNOSIS — S161XXA Strain of muscle, fascia and tendon at neck level, initial encounter: Secondary | ICD-10-CM

## 2020-09-30 DIAGNOSIS — S0990XA Unspecified injury of head, initial encounter: Secondary | ICD-10-CM

## 2020-09-30 DIAGNOSIS — M25511 Pain in right shoulder: Secondary | ICD-10-CM

## 2020-09-30 DIAGNOSIS — M25561 Pain in right knee: Secondary | ICD-10-CM

## 2020-09-30 DIAGNOSIS — Y92410 Unspecified street and highway as the place of occurrence of the external cause: Secondary | ICD-10-CM | POA: Diagnosis not present

## 2020-09-30 DIAGNOSIS — Y903 Blood alcohol level of 60-79 mg/100 ml: Secondary | ICD-10-CM | POA: Insufficient documentation

## 2020-09-30 DIAGNOSIS — S01511A Laceration without foreign body of lip, initial encounter: Secondary | ICD-10-CM | POA: Insufficient documentation

## 2020-09-30 DIAGNOSIS — S0993XA Unspecified injury of face, initial encounter: Secondary | ICD-10-CM | POA: Diagnosis present

## 2020-09-30 DIAGNOSIS — Z20822 Contact with and (suspected) exposure to covid-19: Secondary | ICD-10-CM | POA: Diagnosis not present

## 2020-09-30 DIAGNOSIS — R079 Chest pain, unspecified: Secondary | ICD-10-CM | POA: Diagnosis not present

## 2020-09-30 DIAGNOSIS — M791 Myalgia, unspecified site: Secondary | ICD-10-CM | POA: Diagnosis not present

## 2020-09-30 LAB — RESP PANEL BY RT-PCR (FLU A&B, COVID) ARPGX2
Influenza A by PCR: NEGATIVE
Influenza B by PCR: NEGATIVE
SARS Coronavirus 2 by RT PCR: NEGATIVE

## 2020-09-30 LAB — I-STAT CHEM 8, ED
BUN: 8 mg/dL (ref 6–20)
Calcium, Ion: 1.15 mmol/L (ref 1.15–1.40)
Chloride: 105 mmol/L (ref 98–111)
Creatinine, Ser: 0.8 mg/dL (ref 0.44–1.00)
Glucose, Bld: 86 mg/dL (ref 70–99)
HCT: 37 % (ref 36.0–46.0)
Hemoglobin: 12.6 g/dL (ref 12.0–15.0)
Potassium: 3.3 mmol/L — ABNORMAL LOW (ref 3.5–5.1)
Sodium: 143 mmol/L (ref 135–145)
TCO2: 23 mmol/L (ref 22–32)

## 2020-09-30 LAB — PROTIME-INR
INR: 1.1 (ref 0.8–1.2)
Prothrombin Time: 14.3 seconds (ref 11.4–15.2)

## 2020-09-30 LAB — COMPREHENSIVE METABOLIC PANEL
ALT: 26 U/L (ref 0–44)
AST: 45 U/L — ABNORMAL HIGH (ref 15–41)
Albumin: 4 g/dL (ref 3.5–5.0)
Alkaline Phosphatase: 27 U/L — ABNORMAL LOW (ref 38–126)
Anion gap: 10 (ref 5–15)
BUN: 8 mg/dL (ref 6–20)
CO2: 22 mmol/L (ref 22–32)
Calcium: 9 mg/dL (ref 8.9–10.3)
Chloride: 108 mmol/L (ref 98–111)
Creatinine, Ser: 0.78 mg/dL (ref 0.44–1.00)
GFR, Estimated: >60 mL/min (ref >60)
Glucose, Bld: 88 mg/dL (ref 70–99)
Potassium: 3.5 mmol/L (ref 3.5–5.1)
Sodium: 140 mmol/L (ref 135–145)
Total Bilirubin: 0.4 mg/dL (ref 0.3–1.2)
Total Protein: 7.2 g/dL (ref 6.5–8.1)

## 2020-09-30 LAB — CBC
HCT: 36.9 % (ref 36.0–46.0)
Hemoglobin: 12 g/dL (ref 12.0–15.0)
MCH: 30.3 pg (ref 26.0–34.0)
MCHC: 32.5 g/dL (ref 30.0–36.0)
MCV: 93.2 fL (ref 80.0–100.0)
Platelets: 206 10*3/uL (ref 150–400)
RBC: 3.96 MIL/uL (ref 3.87–5.11)
RDW: 12.9 % (ref 11.5–15.5)
WBC: 5.6 10*3/uL (ref 4.0–10.5)
nRBC: 0 % (ref 0.0–0.2)

## 2020-09-30 LAB — ETHANOL: Alcohol, Ethyl (B): 66 mg/dL — ABNORMAL HIGH (ref <10)

## 2020-09-30 LAB — I-STAT BETA HCG BLOOD, ED (MC, WL, AP ONLY): I-stat hCG, quantitative: <5 m[IU]/mL (ref <5)

## 2020-09-30 LAB — LACTIC ACID, PLASMA: Lactic Acid, Venous: 3.1 mmol/L (ref 0.5–1.9)

## 2020-09-30 MED ORDER — METHOCARBAMOL 500 MG PO TABS: 500.0000 mg | ORAL_TABLET | Freq: Two times a day (BID) | ORAL | 0 refills | Status: AC

## 2020-09-30 MED ORDER — ACETAMINOPHEN 500 MG PO TABS
1000.0000 mg | ORAL_TABLET | Freq: Once | ORAL | Status: AC
  Administered 2020-09-30: 1000 mg via ORAL
  Filled 2020-09-30: qty 2

## 2020-09-30 MED ORDER — LACTATED RINGERS IV BOLUS
1000.0000 mL | Freq: Once | INTRAVENOUS | Status: AC
  Administered 2020-09-30: 1000 mL via INTRAVENOUS

## 2020-09-30 MED ORDER — ONDANSETRON HCL 4 MG/2ML IJ SOLN
4.0000 mg | Freq: Once | INTRAMUSCULAR | Status: AC
  Administered 2020-09-30: 4 mg via INTRAVENOUS
  Filled 2020-09-30: qty 2

## 2020-09-30 MED ORDER — IOHEXOL 350 MG/ML SOLN
75.0000 mL | Freq: Once | INTRAVENOUS | Status: AC | PRN
  Administered 2020-09-30: 75 mL via INTRAVENOUS

## 2020-09-30 MED ORDER — FENTANYL CITRATE PF 50 MCG/ML IJ SOSY
75.0000 ug | PREFILLED_SYRINGE | INTRAMUSCULAR | Status: DC | PRN
  Administered 2020-09-30: 75 ug via INTRAVENOUS
  Filled 2020-09-30: qty 2

## 2020-09-30 MED ORDER — KETOROLAC TROMETHAMINE 30 MG/ML IJ SOLN
30.0000 mg | Freq: Once | INTRAMUSCULAR | Status: AC
  Administered 2020-09-30: 30 mg via INTRAVENOUS
  Filled 2020-09-30: qty 1
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
## 2020-09-30 NOTE — ED Notes (Signed)
Pt discharge instructions reviewed. Pt verbalized discharge instructions and prescriptions reviewed. Pt discharged via wheelchair and picked up by family.
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
## 2020-09-30 NOTE — ED Notes (Signed)
Patient transported to CT 

## 2020-10-01 LAB — SAMPLE TO BLOOD BANK

## 2020-10-11 ENCOUNTER — Encounter (HOSPITAL_COMMUNITY): Payer: Self-pay

## 2020-10-11 ENCOUNTER — Emergency Department (HOSPITAL_COMMUNITY): Payer: Self-pay

## 2020-10-11 ENCOUNTER — Emergency Department (HOSPITAL_COMMUNITY)
Admission: EM | Admit: 2020-10-11 | Discharge: 2020-10-11 | Disposition: A | Discharge status: Home / Self Care | Payer: Self-pay | Attending: Emergency Medicine | Admitting: Emergency Medicine

## 2020-10-11 DIAGNOSIS — R0789 Other chest pain: Secondary | ICD-10-CM | POA: Insufficient documentation

## 2020-10-11 DIAGNOSIS — M546 Pain in thoracic spine: Secondary | ICD-10-CM | POA: Insufficient documentation

## 2020-10-11 DIAGNOSIS — J45909 Unspecified asthma, uncomplicated: Secondary | ICD-10-CM | POA: Insufficient documentation

## 2020-10-11 MED ORDER — CYCLOBENZAPRINE HCL 10 MG PO TABS
5.0000 mg | ORAL_TABLET | Freq: Once | ORAL | Status: AC
  Administered 2020-10-11: 5 mg via ORAL
  Filled 2020-10-11: qty 1

## 2020-10-11 MED ORDER — ACETAMINOPHEN 500 MG PO TABS
1000.0000 mg | ORAL_TABLET | Freq: Once | ORAL | Status: AC
  Administered 2020-10-11: 1000 mg via ORAL
  Filled 2020-10-11: qty 2

## 2020-10-11 MED ORDER — LIDOCAINE 5 % EX PTCH
1.0000 | MEDICATED_PATCH | CUTANEOUS | Status: DC
  Administered 2020-10-11: 1 via TRANSDERMAL
  Filled 2020-10-11: qty 1

## 2020-10-11 MED ORDER — KETOROLAC TROMETHAMINE 60 MG/2ML IM SOLN
60.0000 mg | Freq: Once | INTRAMUSCULAR | Status: AC
  Administered 2020-10-11: 60 mg via INTRAMUSCULAR
  Filled 2020-10-11: qty 2
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
## 2020-10-11 NOTE — Discharge Instructions (Addendum)
You came to the emergency department today to be evaluated for your back pain.  Your chest x-ray showed no acute abnormalities.  Your EKG was reassuring.  Your pain is likely musculoskeletal in nature.  Pain was improved after receiving medication in the emergency department.  Please continue to use your prescribed Robaxin. Please take Ibuprofen (Advil, motrin) and Tylenol (acetaminophen) to relieve your pain.    You may take up to 600 MG (3 pills) of normal strength ibuprofen every 8 hours as needed.   You make take tylenol, up to 1,000 mg (two extra strength pills) every 8 hours as needed.   It is safe to take ibuprofen and tylenol at the same time as they work differently.   Do not take more than 3,000 mg tylenol in a 24 hour period (not more than one dose every 8 hours.  Please check all medication labels as many medications such as pain and cold medications may contain tylenol.  Do not drink alcohol while taking these medications.  Do not take other NSAID'S while taking ibuprofen (such as aleve or naproxen).  Please take ibuprofen with food to decrease stomach upset.   Get help right away if: You develop new bowel or bladder control problems. You have unusual weakness or numbness in your arms or legs. You feel faint.
